# Patient Record
Sex: Male | Born: 1970 | Race: White | Hispanic: No | Marital: Married | Smoking: Former smoker
Health system: Southern US, Community
[De-identification: ages and names within clinical notes are randomized; demographics above are authoritative.]

## PROBLEM LIST (undated history)

## (undated) DIAGNOSIS — R55 Syncope and collapse: Secondary | ICD-10-CM

## (undated) DIAGNOSIS — E782 Mixed hyperlipidemia: Secondary | ICD-10-CM

## (undated) DIAGNOSIS — A6000 Herpesviral infection of urogenital system, unspecified: Secondary | ICD-10-CM

## (undated) DIAGNOSIS — S12101A Unspecified nondisplaced fracture of second cervical vertebra, initial encounter for closed fracture: Secondary | ICD-10-CM

## (undated) DIAGNOSIS — G5600 Carpal tunnel syndrome, unspecified upper limb: Secondary | ICD-10-CM

## (undated) DIAGNOSIS — M549 Dorsalgia, unspecified: Secondary | ICD-10-CM

## (undated) DIAGNOSIS — I10 Essential (primary) hypertension: Secondary | ICD-10-CM

## (undated) HISTORY — DX: Syncope and collapse: R55

## (undated) HISTORY — DX: Herpesviral infection of urogenital system, unspecified: A60.00

## (undated) HISTORY — DX: Carpal tunnel syndrome, unspecified upper limb: G56.00

## (undated) HISTORY — DX: Essential (primary) hypertension: I10

## (undated) HISTORY — DX: Unspecified nondisplaced fracture of second cervical vertebra, initial encounter for closed fracture: S12.101A

## (undated) HISTORY — DX: Dorsalgia, unspecified: M54.9

## (undated) HISTORY — DX: Mixed hyperlipidemia: E78.2

---

## 2007-04-24 HISTORY — PX: ARTHROSCOPIC REPAIR ACL: SUR80

## 2009-12-02 ENCOUNTER — Emergency Department: Payer: Self-pay | Admitting: Emergency Medicine

## 2009-12-06 ENCOUNTER — Ambulatory Visit: Payer: Self-pay | Admitting: Specialist

## 2009-12-29 ENCOUNTER — Ambulatory Visit: Payer: Self-pay | Admitting: Anesthesiology

## 2010-02-06 ENCOUNTER — Ambulatory Visit: Payer: Self-pay | Admitting: Anesthesiology

## 2010-03-01 ENCOUNTER — Ambulatory Visit: Payer: Self-pay | Admitting: Anesthesiology

## 2012-07-16 DIAGNOSIS — G5600 Carpal tunnel syndrome, unspecified upper limb: Secondary | ICD-10-CM

## 2012-07-16 HISTORY — DX: Carpal tunnel syndrome, unspecified upper limb: G56.00

## 2012-08-20 DIAGNOSIS — M549 Dorsalgia, unspecified: Secondary | ICD-10-CM

## 2012-08-20 HISTORY — DX: Dorsalgia, unspecified: M54.9

## 2013-04-13 DIAGNOSIS — A6000 Herpesviral infection of urogenital system, unspecified: Secondary | ICD-10-CM | POA: Insufficient documentation

## 2013-04-13 HISTORY — DX: Herpesviral infection of urogenital system, unspecified: A60.00

## 2013-06-05 DIAGNOSIS — Z308 Encounter for other contraceptive management: Secondary | ICD-10-CM | POA: Insufficient documentation

## 2015-10-17 DIAGNOSIS — S12101A Unspecified nondisplaced fracture of second cervical vertebra, initial encounter for closed fracture: Secondary | ICD-10-CM

## 2015-10-17 HISTORY — DX: Unspecified nondisplaced fracture of second cervical vertebra, initial encounter for closed fracture: S12.101A

## 2016-04-05 DIAGNOSIS — R55 Syncope and collapse: Secondary | ICD-10-CM

## 2016-04-05 DIAGNOSIS — E782 Mixed hyperlipidemia: Secondary | ICD-10-CM

## 2016-04-05 DIAGNOSIS — I10 Essential (primary) hypertension: Secondary | ICD-10-CM

## 2016-04-05 HISTORY — DX: Essential (primary) hypertension: I10

## 2016-04-05 HISTORY — DX: Syncope and collapse: R55

## 2016-04-05 HISTORY — DX: Mixed hyperlipidemia: E78.2

## 2017-05-09 ENCOUNTER — Other Ambulatory Visit: Payer: Self-pay

## 2017-05-31 ENCOUNTER — Encounter: Payer: Self-pay | Admitting: Urology

## 2017-05-31 ENCOUNTER — Ambulatory Visit (INDEPENDENT_AMBULATORY_CARE_PROVIDER_SITE_OTHER): Payer: BLUE CROSS/BLUE SHIELD | Admitting: Urology

## 2017-05-31 VITALS — BP 133/81 | HR 64 | Ht 70.0 in | Wt 215.0 lb

## 2017-05-31 DIAGNOSIS — R972 Elevated prostate specific antigen [PSA]: Secondary | ICD-10-CM

## 2017-05-31 NOTE — Progress Notes (Signed)
05/31/2017 4:08 PM   Craig Villa 11-12-1970 [id number]  Referring provider: Dione Housekeeperlmedo, Mario Ernesto, MD [address] [place], [place] [id number]  Chief Complaint  Patient presents with  . Elevated PSA    New Patient    HPI: 47 year old male referred for further evaluation of elevated PSA.  He had a PSA in 05/06/2017 which was 2.43.  This appears to have been essentially stable over the past 2 years.  Care everywhere reviewed for details.  He denies any baseline urinary symptoms including no frequency, urgency, nocturia, decreased stream, or sensation of incomplete bladder emptying.  He denies history of UTIs or gross hematuria.  No family history of prostate cancer.  No weight loss or bone pain.  PMH: Past Medical History:  Diagnosis Date  . Back pain 08/20/2012   Overview:  Had ortho eval including [facility] and then seen [facility] in [place].  . Cardiac syncope 04/05/2016  . Carpal tunnel syndrome 07/16/2012   Overview:  EMG/NCS 11/02 showed moderate Right and Mild Left, saw Reita ChardChris Smith, elected non surgical  . Closed nondisplaced fracture of second cervical vertebra (HCC) 10/17/2015  . Essential hypertension 04/05/2016  . Herpes genitalis 04/13/2013   Overview:  Likely outbreak, not confirmed, 11/14  . Hyperlipidemia, mixed 04/05/2016   Overview:  lipitor started 12/17  . Hypertension     Surgical History: Past Surgical History:  Procedure Laterality Date  . ARTHROSCOPIC REPAIR ACL  2009    Home Medications:  Allergies as of 05/31/2017   Not on File     Medication List        Accurate as of 05/31/17  4:08 PM. Always use your most recent med list.          atorvastatin 40 MG tablet Commonly known as:  LIPITOR   lisinopril-hydrochlorothiazide 20-25 MG tablet Commonly known as:  PRINZIDE,ZESTORETIC Take by mouth.   meloxicam 15 MG tablet Commonly known as:  MOBIC Take by mouth.       Allergies: Not on File  Family History: Family  History  Problem Relation Age of Onset  . Prostate cancer Neg Hx   . Kidney cancer Neg Hx     Social History:  reports that he has quit smoking. he has never used smokeless tobacco. He reports that he drinks alcohol. He reports that he does not use drugs.  ROS: UROLOGY Frequent Urination?: No Hard to postpone urination?: No Burning/pain with urination?: No Get up at night to urinate?: No Leakage of urine?: No Urine stream starts and stops?: No Trouble starting stream?: No Do you have to strain to urinate?: No Blood in urine?: No Urinary tract infection?: No Sexually transmitted disease?: No Injury to kidneys or bladder?: No Painful intercourse?: No Weak stream?: No Erection problems?: No Penile pain?: No  Gastrointestinal Nausea?: No Vomiting?: No Indigestion/heartburn?: No Diarrhea?: No Constipation?: No  Constitutional Fever: No Night sweats?: No Weight loss?: No Fatigue?: No  Skin Skin rash/lesions?: No Itching?: No  Eyes Blurred vision?: No Double vision?: No  Ears/Nose/Throat Sore throat?: No Sinus problems?: No  Hematologic/Lymphatic Swollen glands?: No Easy bruising?: No  Cardiovascular Leg swelling?: No Chest pain?: No  Respiratory Shortness of breath?: No  Endocrine Excessive thirst?: No  Musculoskeletal Back pain?: No Joint pain?: No  Neurological Headaches?: No Dizziness?: No  Psychologic Depression?: No Anxiety?: No  Physical Exam: BP 133/81   Pulse 64   Ht 5\' 10"  (1.778 m)   Wt 215 lb (97.5 kg)   BMI  30.85 kg/m   Constitutional:  Alert and oriented, No acute distress. HEENT: [place] AT, moist mucus membranes.  Trachea midline, no masses. Cardiovascular: No clubbing, cyanosis, or edema. Respiratory: Normal respiratory effort, no increased work of breathing. GI: Abdomen is soft, nontender, nondistended, no abdominal masses GU: No CVA tenderness.  Rectal: Small external hemorrhoid.  Normal sphincter tone.  40 cc prostate,  nontender, no nodules. Skin: No rashes, bruises or suspicious lesions. Neurologic: Grossly intact, no focal deficits, moving all 4 extremities. Psychiatric: Normal mood and affect.  Laboratory Data: Creatinine 1.0 on 05/06/2017  PSA trend 05/06/2017 2.43 11/22/2015 2.54 10/07/2014 2.61   Urinalysis n/a  Pertinent Imaging: N/a  Assessment & Plan:    1. Elevated PSA  We reviewed the implications of an elevated PSA and the uncertainty surrounding it. In general, a man's PSA increases with age and is produced by both normal and cancerous prostate tissue. Differential for elevated PSA is BPH, prostate cancer, infection, recent intercourse/ejaculation, prostate infarction, recent urethroscopic manipulation (foley placement/cystoscopy) and prostatitis. Management of an elevated PSA can include observation or prostate biopsy and wediscussed this in detail.  We discussed that indications for prostate biopsy are defined by age and race specific PSA cutoffs as well as a PSA velocity of 0.75/year.  Although his PSA is more elevated than one would expect for his age, he does have a enlarged prostate therefore his PSA density may be appropriate.  The fact that his PSA has been stable over the past 3 years without any fluctuation is very reassuring.  His rectal exam was also stable.  I recommended referral back to urology if his PSA increases or than 0.5 in 1 year.  He is agreeable with this plan.    Vanna Scotland, MD  [facility] [address] [place], [place] [address] [phone]

## 2017-06-12 ENCOUNTER — Other Ambulatory Visit: Payer: Self-pay

## 2017-06-12 ENCOUNTER — Ambulatory Visit
Admission: EM | Admit: 2017-06-12 | Discharge: 2017-06-12 | Disposition: A | Discharge status: Home / Self Care | Payer: BLUE CROSS/BLUE SHIELD | Attending: Family Medicine | Admitting: Family Medicine

## 2017-06-12 DIAGNOSIS — R1032 Left lower quadrant pain: Secondary | ICD-10-CM

## 2017-06-12 LAB — URINALYSIS, COMPLETE (UACMP) WITH MICROSCOPIC
BACTERIA UA: NONE SEEN
Bilirubin Urine: NEGATIVE
GLUCOSE, UA: NEGATIVE mg/dL
Hgb urine dipstick: NEGATIVE
Leukocytes, UA: NEGATIVE
Nitrite: NEGATIVE
PROTEIN: NEGATIVE mg/dL
RBC / HPF: NONE SEEN RBC/hpf (ref 0–5)
SQUAMOUS EPITHELIAL / LPF: NONE SEEN
Specific Gravity, Urine: 1.02 (ref 1.005–1.030)
pH: 5.5 (ref 5.0–8.0)

## 2017-06-12 NOTE — Discharge Instructions (Signed)
Recommend patient go to Emergency Department for further evaluation and management °

## 2017-06-12 NOTE — ED Triage Notes (Addendum)
Pt c/o abdominal pain since 1 am in his lower abdomen and radiates to the left side. He had a temperature 101.7, he tried to sleep it off and it hasn't gone away. He has recently rechecked temp and it is normal. Thinks may be a hernia. Pain is new and he has noticed a change in bowel habits the last couple days.

## 2017-06-12 NOTE — ED Provider Notes (Signed)
[facility]    CSN: [id number] Arrival date & time: 06/12/17  1641     History   Chief Complaint Chief Complaint  Patient presents with  . Abdominal Pain    lower left side    HPI Craig Villa is a 47 y.o. male.   The history is provided by the patient.  Abdominal Pain  Pain location:  LLQ Pain quality: aching and throbbing   Pain radiates to:  Does not radiate Pain severity:  Moderate Onset quality:  Sudden Duration:  16 hours Timing:  Constant Progression:  Waxing and waning Chronicity:  New Context: awakening from sleep (states woke up in the middle of the night last nigh with the pain)   Context: not alcohol use, not diet changes, not eating, not laxative use, not medication withdrawal, not previous surgeries, not recent illness, not recent sexual activity, not recent travel, not retching, not sick contacts, not suspicious food intake and not trauma   Relieved by:  NSAIDs (ibuprofen) Associated symptoms: chills (states last night had some chills), fever and nausea   Associated symptoms: no anorexia, no belching, no chest pain, no constipation, no cough, no diarrhea, no dysuria, no fatigue, no flatus, no hematemesis, no hematochezia, no hematuria, no melena, no shortness of breath, no sore throat, no vaginal bleeding, no vaginal discharge and no vomiting   Risk factors: no alcohol abuse, no aspirin use, not elderly, has not had multiple surgeries, not obese and no recent hospitalization     Past Medical History:  Diagnosis Date  . Back pain 08/20/2012   Overview:  Had [facility] eval [facility] and then seen [facility] in [place].  . Cardiac syncope 04/05/2016  . Carpal tunnel syndrome 07/16/2012   Overview:  EMG/NCS 11/02 showed moderate Right and Mild Left, saw Reita ChardChris Smith, elected non surgical  . Closed nondisplaced fracture of second cervical vertebra (HCC) 10/17/2015  . Essential hypertension 04/05/2016  . Herpes genitalis 04/13/2013   Overview:  Likely outbreak, not confirmed, 11/14  . Hyperlipidemia, mixed 04/05/2016   Overview:  lipitor started 12/17  . Hypertension     Patient Active Problem List   Diagnosis Date Noted  . Cardiac syncope 04/05/2016  . Essential hypertension 04/05/2016  . Hyperlipidemia, mixed 04/05/2016  . Closed nondisplaced fracture of second cervical vertebra (HCC) 10/17/2015  . Encounter for other contraceptive management 06/05/2013  . Herpes genitalis 04/13/2013  . Back pain 08/20/2012  . Carpal tunnel syndrome 07/16/2012    Past Surgical History:  Procedure Laterality Date  . ARTHROSCOPIC REPAIR ACL  2009       Home Medications    Prior to Admission medications   Medication Sig Start Date End Date Taking? Authorizing Provider  lisinopril-hydrochlorothiazide (PRINZIDE,ZESTORETIC) 20-25 MG tablet Take by mouth. 05/06/17 06/06/18 Yes [provider]  meloxicam (MOBIC) 15 MG tablet Take by mouth. 05/06/17 06/06/18 Yes [provider]  atorvastatin (LIPITOR) 40 MG tablet  04/29/17   [provider]    Family History Family History  Problem Relation Age of Onset  . Prostate cancer Neg Hx   . Kidney cancer Neg Hx     Social History Social History   Tobacco Use  . Smoking status: Former Games developermoker  . Smokeless tobacco: Never Used  Substance Use Topics  . Alcohol use: Yes  . Drug use: No     Allergies   Patient has no known allergies.   Review of Systems Review of Systems  Constitutional: Positive for chills (states  last night had some chills) and fever. Negative for fatigue.  HENT: Negative for sore throat.   Respiratory: Negative for cough and shortness of breath.   Cardiovascular: Negative for chest pain.  Gastrointestinal: Positive for abdominal pain and nausea. Negative for anorexia, constipation, diarrhea, flatus, hematemesis, hematochezia, melena and vomiting.  Genitourinary: Negative for dysuria, hematuria, vaginal bleeding and vaginal  discharge.     Physical Exam Triage Vital Signs ED Triage Vitals  Enc Vitals Group     BP 06/12/17 1708 127/89     Pulse Rate 06/12/17 1708 77     Resp 06/12/17 1708 18     Temp 06/12/17 1708 98.1 F (36.7 C)     Temp Source 06/12/17 1708 Oral     SpO2 06/12/17 1708 100 %     Weight 06/12/17 1710 215 lb (97.5 kg)     Height --      Head Circumference --      Peak Flow --      Pain Score 06/12/17 1710 7     Pain Loc --      Pain Edu? --      Excl. in GC? --    No data found.  Updated Vital Signs BP 127/89 (BP Location: Left Arm)   Pulse 77   Temp 98.1 F (36.7 C) (Oral)   Resp 18   Wt 215 lb (97.5 kg)   SpO2 100%   BMI 30.85 kg/m   Visual Acuity Right Eye Distance:   Left Eye Distance:   Bilateral Distance:    Right Eye Near:   Left Eye Near:    Bilateral Near:     Physical Exam  Constitutional: He is oriented to person, place, and time. He appears well-developed and well-nourished. No distress.  HENT:  Head: Normocephalic and atraumatic.  Cardiovascular: Normal rate, regular rhythm, normal heart sounds and intact distal pulses.  No murmur heard. Pulmonary/Chest: Effort normal and breath sounds normal. No respiratory distress. He has no wheezes. He has no rales.  Abdominal: Soft. Bowel sounds are normal. He exhibits no distension and no mass. There is tenderness (left lower quadrant; moderate). There is no rebound and no guarding.  Neurological: He is alert and oriented to person, place, and time.  Skin: No rash noted. He is not diaphoretic.  Nursing note and vitals reviewed.    UC Treatments / Results  Labs (all labs ordered are listed, but only abnormal results are displayed) Labs Reviewed  URINALYSIS, COMPLETE (UACMP) WITH MICROSCOPIC - Abnormal; Notable for the following components:      Result Value   Ketones, ur TRACE (*)    All other components within normal limits    EKG  EKG Interpretation None       Radiology No results  found.  Procedures Procedures (including critical care time)  Medications Ordered in UC Medications - No data to display   Initial Impression / Assessment and Plan / UC Course  I have reviewed the triage vital signs and the nursing notes.  Pertinent labs & imaging results that were available during my care of the patient were reviewed by me and considered in my medical decision making (see chart for details).       Final Clinical Impressions(s) / UC Diagnoses   Final diagnoses:  Abdominal pain, left lower quadrant    ED Discharge Orders    None     1. Lab result (negative UA) and possible/probable diagnosis reviewed with patient; explained concern for acute diverticulitis  and recommend that patient go to the Emergency Department for further evaluation and management; patient verbalizes understanding and will proceed by private vehicle.   Controlled Substance Prescriptions [place] Controlled Substance Registry consulted? Not Applicable   Payton Mccallum, MD 06/12/17 [phone]

## 2020-05-06 ENCOUNTER — Encounter: Payer: Self-pay | Admitting: Emergency Medicine

## 2020-05-06 ENCOUNTER — Other Ambulatory Visit: Payer: Self-pay

## 2020-05-06 ENCOUNTER — Ambulatory Visit (INDEPENDENT_AMBULATORY_CARE_PROVIDER_SITE_OTHER): Payer: BLUE CROSS/BLUE SHIELD

## 2020-05-06 ENCOUNTER — Ambulatory Visit
Admission: EM | Admit: 2020-05-06 | Discharge: 2020-05-06 | Disposition: A | Discharge status: Home / Self Care | Payer: BLUE CROSS/BLUE SHIELD | Attending: Family Medicine | Admitting: Family Medicine

## 2020-05-06 DIAGNOSIS — Z79899 Other long term (current) drug therapy: Secondary | ICD-10-CM | POA: Diagnosis not present

## 2020-05-06 DIAGNOSIS — J1282 Pneumonia due to coronavirus disease 2019: Secondary | ICD-10-CM | POA: Diagnosis not present

## 2020-05-06 DIAGNOSIS — R109 Unspecified abdominal pain: Secondary | ICD-10-CM | POA: Diagnosis present

## 2020-05-06 DIAGNOSIS — U071 COVID-19: Secondary | ICD-10-CM | POA: Diagnosis not present

## 2020-05-06 DIAGNOSIS — R059 Cough, unspecified: Secondary | ICD-10-CM | POA: Diagnosis not present

## 2020-05-06 DIAGNOSIS — Z87891 Personal history of nicotine dependence: Secondary | ICD-10-CM | POA: Diagnosis not present

## 2020-05-06 LAB — COMPREHENSIVE METABOLIC PANEL
ALT: 56 U/L — ABNORMAL HIGH (ref 0–44)
AST: 41 U/L (ref 15–41)
Albumin: 4.1 g/dL (ref 3.5–5.0)
Alkaline Phosphatase: 63 U/L (ref 38–126)
Anion gap: 11 (ref 5–15)
BUN: 14 mg/dL (ref 6–20)
CO2: 26 mmol/L (ref 22–32)
Calcium: 9.5 mg/dL (ref 8.9–10.3)
Chloride: 96 mmol/L — ABNORMAL LOW (ref 98–111)
Creatinine, Ser: 0.93 mg/dL (ref 0.61–1.24)
GFR, Estimated: >60 mL/min (ref >60)
Glucose, Bld: 100 mg/dL — ABNORMAL HIGH (ref 70–99)
Potassium: 3.9 mmol/L (ref 3.5–5.1)
Sodium: 133 mmol/L — ABNORMAL LOW (ref 135–145)
Total Bilirubin: 0.6 mg/dL (ref 0.3–1.2)
Total Protein: 8.1 g/dL (ref 6.5–8.1)

## 2020-05-06 LAB — CBC WITH DIFFERENTIAL/PLATELET
Abs Immature Granulocytes: 0.02 10*3/uL (ref 0.00–0.07)
Basophils Absolute: 0 10*3/uL (ref 0.0–0.1)
Basophils Relative: 0 %
Eosinophils Absolute: 0 10*3/uL (ref 0.0–0.5)
Eosinophils Relative: 0 %
HCT: 46.8 % (ref 39.0–52.0)
Hemoglobin: 16.4 g/dL (ref 13.0–17.0)
Immature Granulocytes: 1 %
Lymphocytes Relative: 30 %
Lymphs Abs: 1.2 10*3/uL (ref 0.7–4.0)
MCH: 32.2 pg (ref 26.0–34.0)
MCHC: 35 g/dL (ref 30.0–36.0)
MCV: 91.9 fL (ref 80.0–100.0)
Monocytes Absolute: 0.5 10*3/uL (ref 0.1–1.0)
Monocytes Relative: 11 %
Neutro Abs: 2.4 10*3/uL (ref 1.7–7.7)
Neutrophils Relative %: 58 %
Platelets: 143 10*3/uL — ABNORMAL LOW (ref 150–400)
RBC: 5.09 MIL/uL (ref 4.22–5.81)
RDW: 13.2 % (ref 11.5–15.5)
WBC: 4.2 10*3/uL (ref 4.0–10.5)
nRBC: 0 % (ref 0.0–0.2)

## 2020-05-06 LAB — LIPASE, BLOOD: Lipase: 36 U/L (ref 11–51)

## 2020-05-06 MED ORDER — HYDROCOD POLST-CPM POLST ER 10-8 MG/5ML PO SUER: 5.0000 mL | Freq: Every evening | ORAL | 0 refills | Status: AC | PRN

## 2020-05-06 MED ORDER — KETOROLAC TROMETHAMINE 10 MG PO TABS
10.0000 mg | ORAL_TABLET | Freq: Four times a day (QID) | ORAL | 0 refills | Status: AC | PRN
Start: 2020-05-06 — End: ?

## 2020-05-06 NOTE — Discharge Instructions (Signed)
Medication as prescribed.  Take care  Dr. [employer]  

## 2020-05-06 NOTE — ED Provider Notes (Signed)
[facility]    CSN: [id number] Arrival date & time: 05/06/20  1007      History   Chief Complaint Chief Complaint  Patient presents with  . Diarrhea  . Abdominal Cramping  . Insomnia   HPI  49 year old male presents with the above complaints.  Patient reports that he developed symptoms on 1/4. Tested positive for COVID on 1/6. Patient states that he was doing well until the past 2 days. He states that he has now developed diarrhea, abdominal pain, and cough. He reports severe fatigue. He states that his abdominal pain is diffuse and feels crampy in nature. He states that he is having chills. He states that this is keeping him up at night. He is getting very little sleep. No relieving factors. No fever. No other complaints at this time.  Past Medical History:  Diagnosis Date  . Back pain 08/20/2012   Overview:  Had ortho eval including MRI at [facility] and then seen [facility] in [place].  . Cardiac syncope 04/05/2016  . Carpal tunnel syndrome 07/16/2012   Overview:  EMG/NCS 11/02 showed moderate Right and Mild Left, saw Reita Chard, elected non surgical  . Closed nondisplaced fracture of second cervical vertebra (HCC) 10/17/2015  . Essential hypertension 04/05/2016  . Herpes genitalis 04/13/2013   Overview:  Likely outbreak, not confirmed, 11/14  . Hyperlipidemia, mixed 04/05/2016   Overview:  lipitor started 12/17  . Hypertension     Patient Active Problem List   Diagnosis Date Noted  . Cardiac syncope 04/05/2016  . Essential hypertension 04/05/2016  . Hyperlipidemia, mixed 04/05/2016  . Closed nondisplaced fracture of second cervical vertebra (HCC) 10/17/2015  . Encounter for other contraceptive management 06/05/2013  . Herpes genitalis 04/13/2013  . Back pain 08/20/2012  . Carpal tunnel syndrome 07/16/2012    Past Surgical History:  Procedure Laterality Date  . ARTHROSCOPIC REPAIR ACL  2009       Home Medications    Prior to Admission  medications   Medication Sig Start Date End Date Taking? Authorizing Provider  chlorpheniramine-HYDROcodone (TUSSIONEX PENNKINETIC ER) 10-8 MG/5ML SUER Take 5 mLs by mouth at bedtime as needed for cough. 05/06/20  Yes Yeshaya Vath G, DO  ketorolac (TORADOL) 10 MG tablet Take 1 tablet (10 mg total) by mouth every 6 (six) hours as needed for moderate pain or severe pain. 05/06/20  Yes Kodie Pick G, DO  lisinopril-hydrochlorothiazide (PRINZIDE,ZESTORETIC) 20-25 MG tablet Take by mouth. 05/06/17 05/06/20 Yes [provider]  atorvastatin (LIPITOR) 40 MG tablet  04/29/17 05/06/20  [provider]    Family History Family History  Problem Relation Age of Onset  . Prostate cancer Neg Hx   . Kidney cancer Neg Hx     Social History Social History   Tobacco Use  . Smoking status: Former Games developer  . Smokeless tobacco: Never Used  Substance Use Topics  . Alcohol use: Yes  . Drug use: No     Allergies   Patient has no known allergies.   Review of Systems Review of Systems Per HPI  Physical Exam Triage Vital Signs ED Triage Vitals  Enc Vitals Group     BP 05/06/20 1130 (!) 146/103     Pulse Rate 05/06/20 1130 69     Resp 05/06/20 1130 18     Temp 05/06/20 1130 99.2 F (37.3 C)     Temp Source 05/06/20 1130 Oral     SpO2 05/06/20 1130 100 %     Weight 05/06/20  1128 195 lb (88.5 kg)     Height 05/06/20 1128 5\' 10"  (1.778 m)     Head Circumference --      Peak Flow --      Pain Score 05/06/20 1127 0     Pain Loc --      Pain Edu? --      Excl. in GC? --    Updated Vital Signs BP (!) 146/103 (BP Location: Right Arm)   Pulse 69   Temp 99.2 F (37.3 C) (Oral)   Resp 18   Ht 5\' 10"  (1.778 m)   Wt 88.5 kg   SpO2 100%   BMI 27.98 kg/m   Visual Acuity Right Eye Distance:   Left Eye Distance:   Bilateral Distance:    Right Eye Near:   Left Eye Near:    Bilateral Near:     Physical Exam Constitutional:      General: He is not in acute distress.     Appearance: Normal appearance.  HENT:     Head: Normocephalic and atraumatic.  Eyes:     General:        Right eye: No discharge.        Left eye: No discharge.     Conjunctiva/sclera: Conjunctivae normal.  Cardiovascular:     Rate and Rhythm: Normal rate and regular rhythm.     Heart sounds: No murmur heard.   Pulmonary:     Effort: Pulmonary effort is normal.     Breath sounds: No wheezing.  Abdominal:     General: There is no distension.     Palpations: Abdomen is soft.     Tenderness: There is no abdominal tenderness.  Neurological:     Mental Status: He is alert.  Psychiatric:        Mood and Affect: Mood normal.        Behavior: Behavior normal.     UC Treatments / Results  Labs (all labs ordered are listed, but only abnormal results are displayed) Labs Reviewed  CBC WITH DIFFERENTIAL/PLATELET - Abnormal; Notable for the following components:      Result Value   Platelets 143 (*)    All other components within normal limits  COMPREHENSIVE METABOLIC PANEL - Abnormal; Notable for the following components:   Sodium 133 (*)    Chloride 96 (*)    Glucose, Bld 100 (*)    ALT 56 (*)    All other components within normal limits  LIPASE, BLOOD    EKG   Radiology DG Chest 2 View  Result Date: 05/06/2020 CLINICAL DATA:  Cough EXAM: CHEST - 2 VIEW COMPARISON:  None FINDINGS: Trachea midline. Cardiomediastinal contours and hilar structures are normal. Bilateral patchy opacities in the chest, for instance in the LEFT upper to mid chest and RIGHT mid chest also in the RIGHT upper lobe. No lobar consolidation. No sign of pleural effusion. On limited assessment no acute skeletal process. IMPRESSION: Bilateral patchy opacities in the chest, suspicious for multifocal pneumonia, consider atypical or viral process such as COVID-19 based on appearance and distribution. Given nodular pattern would suggest follow-up to ensure resolution. Electronically Signed   By:  M.D.   On: 05/06/2020 12:08    Procedures Procedures (including critical care time)  Medications Ordered in UC Medications - No data to display  Initial Impression / Assessment and Plan / UC Course  I have reviewed the triage vital signs and the nursing notes.  Pertinent labs & imaging  results that were available during my care of the patient were reviewed by me and considered in my medical decision making (see chart for details).    50 year old male presents with symptoms in the setting of COVID-19. Chest x-Tejuan performed and was independently interpreted by me. Interpretation: Multifocal pneumonia consistent with COVID-19 pneumonia.  Laboratory studies unremarkable.  Tussidex for cough.  Toradol for body aches.  Supportive care.  Final Clinical Impressions(s) / UC Diagnoses   Final diagnoses:  Pneumonia due to COVID-19 virus     Discharge Instructions     Medication as prescribed.  Take care  Dr. Adriana Simas     ED Prescriptions    Medication Sig Dispense Auth. Provider   chlorpheniramine-HYDROcodone (TUSSIONEX PENNKINETIC ER) 10-8 MG/5ML SUER Take 5 mLs by mouth at bedtime as needed for cough. 60 mL Julieana Eshleman G, DO   ketorolac (TORADOL) 10 MG tablet Take 1 tablet (10 mg total) by mouth every 6 (six) hours as needed for moderate pain or severe pain. 20 tablet Tommie Sams, DO     PDMP not reviewed this encounter.   Tommie Sams, [place] 05/06/20 1336

## 2020-05-06 NOTE — ED Triage Notes (Signed)
Patient states he tested positive for COVID on 01/06 but symptoms started on 01/04. He states he started feeling better and went back to work. He states he now has diarrhea, abdominal cramping and unable to sleep x 2 days.

## 2021-04-23 IMAGING — CR DG CHEST 2V
2 series · 2 of 2 positions shown · non-contrast
Comparison: None

CLINICAL DATA: Cough

EXAM:
CHEST - 2 VIEW

[chest lat]
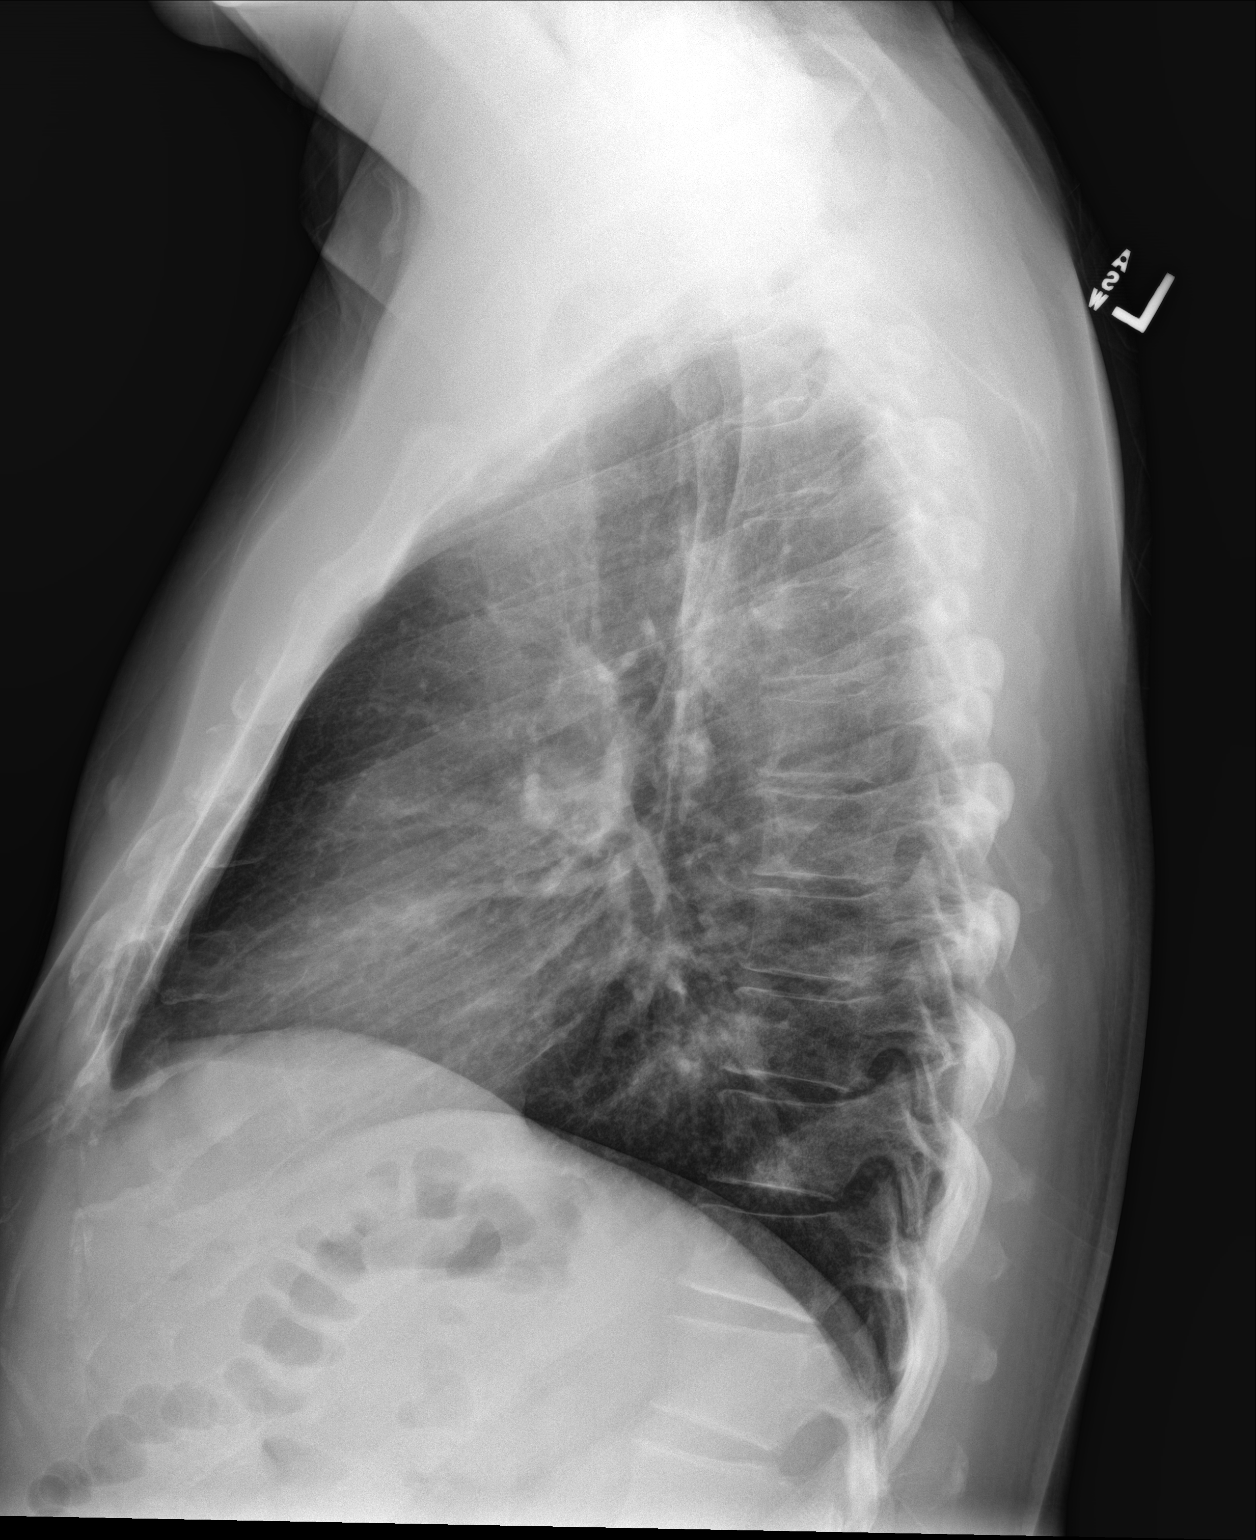

[chest pa]
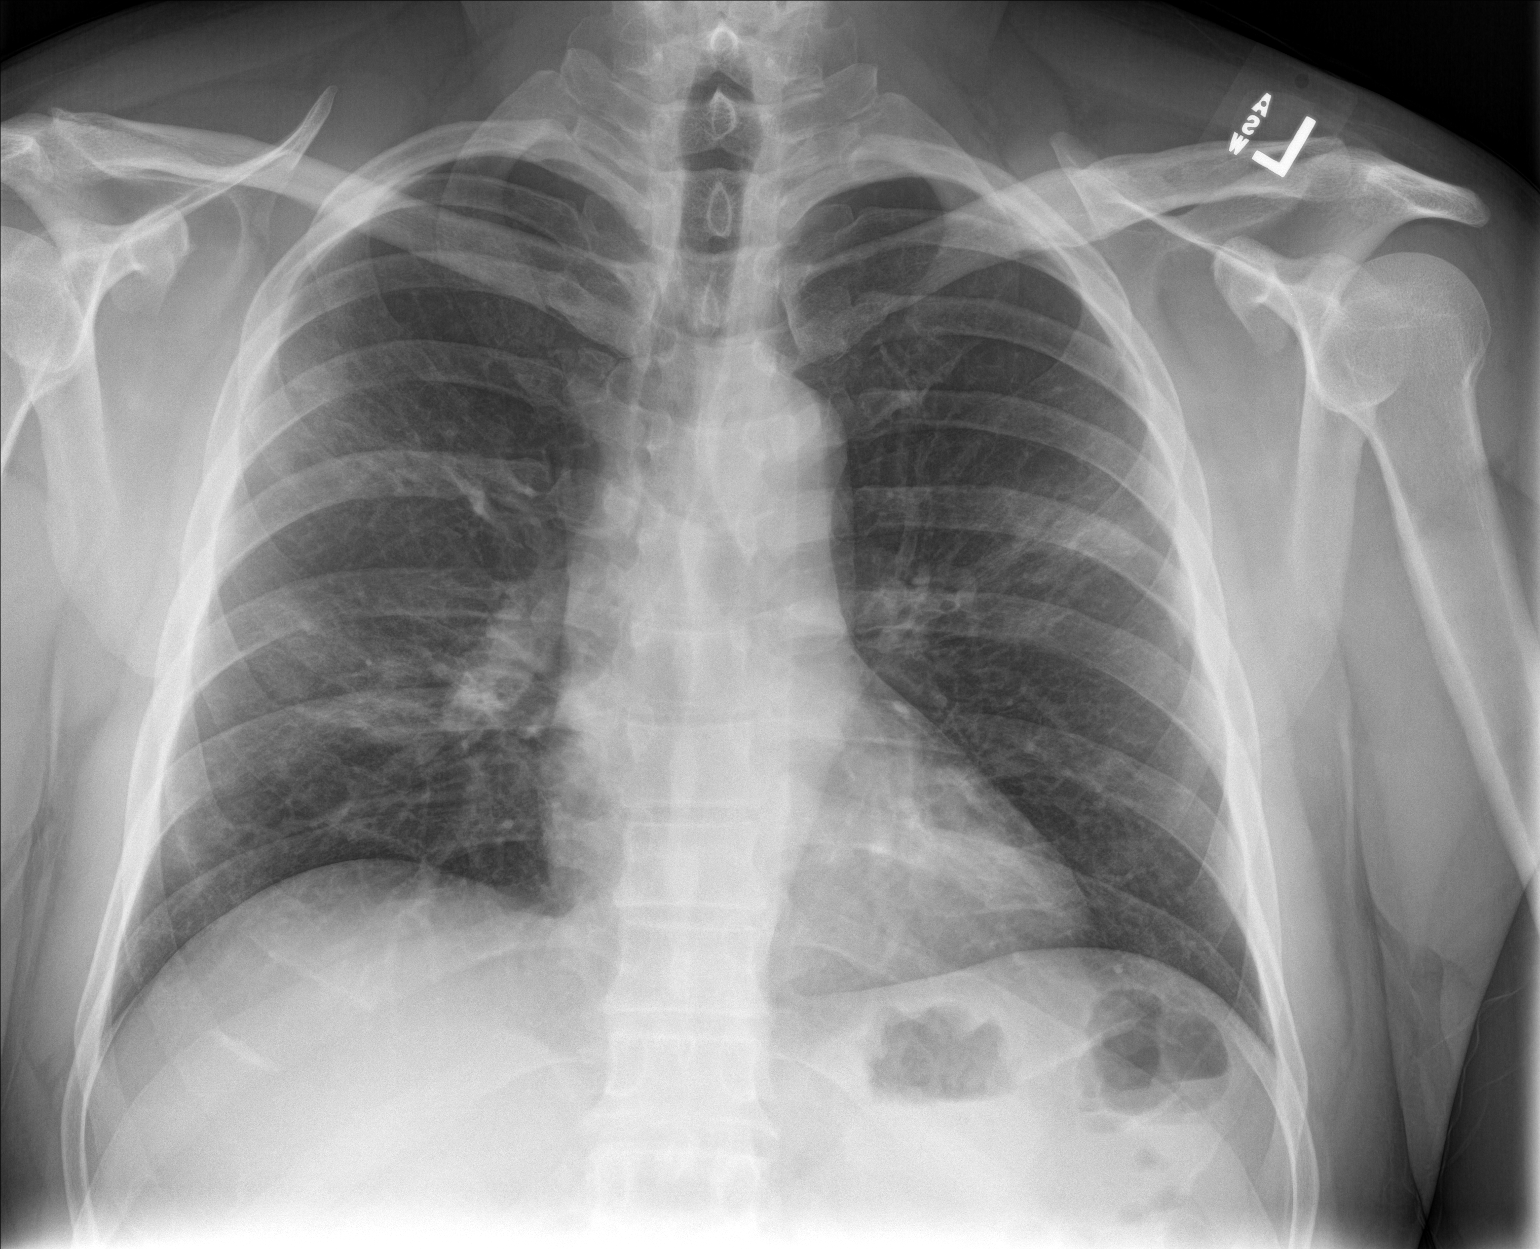

[2 of 2 positions shown; findings below may reference images not displayed]

FINDINGS: Trachea midline. Cardiomediastinal contours and hilar structures are
normal.

Bilateral patchy opacities in the chest, for instance in the LEFT
upper to mid chest and RIGHT mid chest also in the RIGHT upper lobe.
No lobar consolidation. No sign of pleural effusion.

On limited assessment no acute skeletal process.
IMPRESSION: Bilateral patchy opacities in the chest, suspicious for multifocal
pneumonia, consider atypical or viral process such as [id number] based
on appearance and distribution.

Given nodular pattern would suggest follow-up to ensure resolution.

## 2022-03-31 ENCOUNTER — Encounter: Payer: Self-pay | Admitting: Emergency Medicine

## 2022-03-31 ENCOUNTER — Ambulatory Visit
Admission: EM | Admit: 2022-03-31 | Discharge: 2022-03-31 | Disposition: A | Discharge status: Home / Self Care | Payer: BC Managed Care – PPO | Attending: Physician Assistant | Admitting: Physician Assistant

## 2022-03-31 DIAGNOSIS — Z23 Encounter for immunization: Secondary | ICD-10-CM

## 2022-03-31 DIAGNOSIS — S61012A Laceration without foreign body of left thumb without damage to nail, initial encounter: Secondary | ICD-10-CM

## 2022-03-31 MED ORDER — TETANUS-DIPHTH-ACELL PERTUSSIS 5-2.5-18.5 LF-MCG/0.5 IM SUSY
0.5000 mL | PREFILLED_SYRINGE | Freq: Once | INTRAMUSCULAR | Status: AC
  Administered 2022-03-31: 0.5 mL via INTRAMUSCULAR

## 2022-03-31 NOTE — ED Provider Notes (Signed)
[facility]    CSN: [id number] Arrival date & time: 03/31/22  1530      History   Chief Complaint Chief Complaint  Patient presents with   Extremity Laceration    Left thumb    HPI Drevin Ortner is a 51 y.o. male.   HPI  Past Medical History:  Diagnosis Date   Back pain 08/20/2012   Overview:  Had ortho eval including MRI at [facility] and then seen [facility] in [place].   Cardiac syncope 04/05/2016   Carpal tunnel syndrome 07/16/2012   Overview:  EMG/NCS 11/02 showed moderate Right and Mild Left, saw Reita Chard, elected non surgical   Closed nondisplaced fracture of second cervical vertebra (HCC) 10/17/2015   Essential hypertension 04/05/2016   Herpes genitalis 04/13/2013   Overview:  Likely outbreak, not confirmed, 11/14   Hyperlipidemia, mixed 04/05/2016   Overview:  lipitor started 12/17   Hypertension     Patient Active Problem List   Diagnosis Date Noted   Cardiac syncope 04/05/2016   Essential hypertension 04/05/2016   Hyperlipidemia, mixed 04/05/2016   Closed nondisplaced fracture of second cervical vertebra (HCC) 10/17/2015   Encounter for other contraceptive management 06/05/2013   Herpes genitalis 04/13/2013   Back pain 08/20/2012   Carpal tunnel syndrome 07/16/2012    Past Surgical History:  Procedure Laterality Date   ARTHROSCOPIC REPAIR ACL  2009       Home Medications    Prior to Admission medications   Medication Sig Start Date End Date Taking? Authorizing Provider  lisinopril-hydrochlorothiazide (PRINZIDE,ZESTORETIC) 20-25 MG tablet Take by mouth. 05/06/17 05/06/20  [provider]  chlorpheniramine-HYDROcodone (TUSSIONEX PENNKINETIC ER) 10-8 MG/5ML SUER Take 5 mLs by mouth at bedtime as needed for cough. 05/06/20   Tommie Sams, DO  ketorolac (TORADOL) 10 MG tablet Take 1 tablet (10 mg total) by mouth every 6 (six) hours as needed for moderate pain or severe pain. 05/06/20   Tommie Sams, DO  atorvastatin (LIPITOR)  40 MG tablet  04/29/17 05/06/20  [provider]    Family History Family History  Problem Relation Age of Onset   Prostate cancer Neg Hx    Kidney cancer Neg Hx     Social History Social History   Tobacco Use   Smoking status: Former   Smokeless tobacco: Never  Building services engineer Use: Never used  Substance Use Topics   Alcohol use: Yes   Drug use: No     Allergies   Patient has no known allergies.   Review of Systems Review of Systems  Musculoskeletal:  Negative for arthralgias and joint swelling.  Skin:  Positive for wound.  Neurological:  Negative for weakness and numbness.     Physical Exam Triage Vital Signs ED Triage Vitals  Enc Vitals Group     BP 03/31/22 1548 118/75     Pulse Rate 03/31/22 1548 64     Resp 03/31/22 1548 15     Temp 03/31/22 1548 98.5 F (36.9 C)     Temp Source 03/31/22 1548 Oral     SpO2 03/31/22 1548 97 %     Weight 03/31/22 1546 195 lb 1.7 oz (88.5 kg)     Height 03/31/22 1546 5\' 10"  (1.778 m)     Head Circumference --      Peak Flow --      Pain Score 03/31/22 1546 2     Pain Loc --      Pain Edu? --  Excl. in GC? --    No data found.  Updated Vital Signs BP 118/75 (BP Location: Right Arm)   Pulse 64   Temp 98.5 F (36.9 C) (Oral)   Resp 15   Ht 5\' 10"  (1.778 m)   Wt 195 lb 1.7 oz (88.5 kg)   SpO2 97%   BMI 27.99 kg/m   Physical Exam Vitals and nursing note reviewed.  Constitutional:      General: He is not in acute distress.    Appearance: He is well-developed.  HENT:     Head: Normocephalic and atraumatic.  Eyes:     Conjunctiva/sclera: Conjunctivae normal.  Cardiovascular:     Rate and Rhythm: Normal rate and regular rhythm.     Heart sounds: No murmur heard. Pulmonary:     Effort: Pulmonary effort is normal. No respiratory distress.     Breath sounds: Normal breath sounds.  Abdominal:     Palpations: Abdomen is soft.     Tenderness: There is no abdominal tenderness.  Musculoskeletal:         General: No swelling.     Cervical back: Neck supple.  Skin:    General: Skin is warm and dry.     Capillary Refill: Capillary refill takes less than 2 seconds.  Neurological:     Mental Status: He is alert.  Psychiatric:        Mood and Affect: Mood normal.      UC Treatments / Results  Labs (all labs ordered are listed, but only abnormal results are displayed) Labs Reviewed - No data to display  EKG   Radiology No results found.  Procedures Procedures (including critical care time)  Medications Ordered in UC Medications - No data to display  Initial Impression / Assessment and Plan / UC Course  I have reviewed the triage vital signs and the nursing notes.  Pertinent labs & imaging results that were available during my care of the patient were reviewed by me and considered in my medical decision making (see chart for details).     *** Final Clinical Impressions(s) / UC Diagnoses   Final diagnoses:  None   Discharge Instructions   None    ED Prescriptions   None    PDMP not reviewed this encounter.

## 2022-03-31 NOTE — Discharge Instructions (Signed)
-  4 sutures were placed today.  Do not get the area wet for 2 days and then clean it gently with soap and water.  Do not soak your finger.  Take Tylenol as needed for pain relief.  May also apply ice. - Look out for any signs infection including increased redness, swelling, pustular drainage and if you note any signs infection, please return for evaluation.  Otherwise, return in 1 week to have your sutures removed. -Additionally, we did update your tetanus today and it should be good for 10 years.

## 2022-03-31 NOTE — ED Triage Notes (Signed)
Patient states that he cut his left thumb on a razor knife at home around 4 hours ago.

## 2022-04-01 DIAGNOSIS — S61012A Laceration without foreign body of left thumb without damage to nail, initial encounter: Secondary | ICD-10-CM | POA: Diagnosis not present

## 2024-03-17 ENCOUNTER — Ambulatory Visit: Payer: Self-pay | Admitting: Podiatry

## 2024-03-17 ENCOUNTER — Ambulatory Visit (INDEPENDENT_AMBULATORY_CARE_PROVIDER_SITE_OTHER)

## 2024-03-17 ENCOUNTER — Encounter: Payer: Self-pay | Admitting: Podiatry

## 2024-03-17 VITALS — Ht 70.0 in | Wt 195.1 lb

## 2024-03-17 DIAGNOSIS — M722 Plantar fascial fibromatosis: Secondary | ICD-10-CM

## 2024-03-17 MED ORDER — MELOXICAM 15 MG PO TABS: 15.0000 mg | ORAL_TABLET | Freq: Every day | ORAL | 1 refills | Status: DC

## 2024-03-29 DIAGNOSIS — M722 Plantar fascial fibromatosis: Secondary | ICD-10-CM | POA: Diagnosis not present

## 2024-03-29 MED ORDER — BETAMETHASONE SOD PHOS & ACET 6 (3-3) MG/ML IJ SUSP
3.0000 mg | Freq: Once | INTRAMUSCULAR | Status: AC
  Administered 2024-03-29: 3 mg via INTRA_ARTICULAR

## 2024-03-29 NOTE — Progress Notes (Signed)
   Chief Complaint  Patient presents with   Foot Pain    Pt is here due to right heel pain, states he has been dx with plantar fasciitis before, pain has bee there for a while but the last few weeks the pain is constant were sometimes he cannot walk on it, he states he's on his feet a lot due to work and wears boots.    Subjective: 53 y.o. male presenting today for evaluation of right heel pain ongoing for several months.  Last few weeks the pain has increased.   Past Medical History:  Diagnosis Date   Back pain 08/20/2012   Overview:  Had ortho eval including MRI at [facility] and then seen [facility] in [place].   Cardiac syncope 04/05/2016   Carpal tunnel syndrome 07/16/2012   Overview:  EMG/NCS 11/02 showed moderate Right and Mild Left, saw Medford Sharps, elected non surgical   Closed nondisplaced fracture of second cervical vertebra (HCC) 10/17/2015   Essential hypertension 04/05/2016   Herpes genitalis 04/13/2013   Overview:  Likely outbreak, not confirmed, 11/14   Hyperlipidemia, mixed 04/05/2016   Overview:  lipitor started 12/17   Hypertension     Past Surgical History:  Procedure Laterality Date   ARTHROSCOPIC REPAIR ACL  2009   No Known Allergies   Objective: Physical Exam General: The patient is alert and oriented x3 in no acute distress.  Dermatology: Skin is warm, dry and supple bilateral lower extremities. Negative for open lesions or macerations bilateral.   Vascular: Dorsalis Pedis and Posterior Tibial pulses palpable bilateral.  Capillary fill time is immediate to all digits.  Neurological: Grossly intact via light touch  Musculoskeletal: Tenderness to palpation to the plantar aspect of the right heel along the plantar fascia. All other joints range of motion within normal limits bilateral. Strength 5/5 in all groups bilateral.   Radiographic exam RT foot 03/17/2024: Normal osseous mineralization. Joint spaces preserved. No fracture/dislocation/boney  destruction. No other soft tissue abnormalities or radiopaque foreign bodies.   Assessment: 1. Plantar fasciitis right  Plan of Care:  -Patient evaluated. Xrays reviewed.   -Injection of 0.5cc Celestone  soluspan injected into the right plantar fascia  -Prescription for meloxicam  15 mg daily  -Advised against going barefoot.  Recommended supportive tennis shoes and sneakers -Recommend daily calf stretching, specifically calf stretches to alleviate posterior calf tightness -Return to clinic 4 weeks   Thresa EMERSON Sar, DPM [facility]  Dr. Thresa EMERSON Sar, DPM    [address] [place], [place] [address]                Office [phone]  Fax [phone]

## 2024-04-10 ENCOUNTER — Telehealth: Payer: Self-pay

## 2024-04-10 NOTE — Telephone Encounter (Signed)
 Patient's orthotics are in and charges are not yet placed. Patient has appointment in [place] on 04/28/2024. Orthotics are in [facility] bin ready to be sent to [facility].

## 2024-04-28 ENCOUNTER — Ambulatory Visit: Admitting: Podiatry

## 2024-05-12 ENCOUNTER — Encounter: Payer: Self-pay | Admitting: Podiatry

## 2024-05-12 ENCOUNTER — Ambulatory Visit: Admitting: Podiatry

## 2024-05-12 VITALS — Ht 70.0 in | Wt 195.1 lb

## 2024-05-12 DIAGNOSIS — M722 Plantar fascial fibromatosis: Secondary | ICD-10-CM

## 2024-05-12 MED ORDER — BETAMETHASONE SOD PHOS & ACET 6 (3-3) MG/ML IJ SUSP
3.0000 mg | Freq: Once | INTRAMUSCULAR | Status: AC
  Administered 2024-05-12: 3 mg via INTRA_ARTICULAR

## 2024-05-12 NOTE — Progress Notes (Signed)
" ° °  Chief Complaint  Patient presents with   Plantar Fasciitis    Pt is here to f/u on right foot due to plantar fasciitis, he states the pain is still there not as bad as before more of a ache to the heel, states the meloxicam  helps. Picking up orthotics today.    Subjective: 54 y.o. male presenting today for evaluation of right heel pain ongoing for several months.  Overall improvement.   Past Medical History:  Diagnosis Date   Back pain 08/20/2012   Overview:  Had ortho eval including MRI at [facility] and then seen [facility] in [place].   Cardiac syncope 04/05/2016   Carpal tunnel syndrome 07/16/2012   Overview:  EMG/NCS 11/02 showed moderate Right and Mild Left, saw [facility] Sharps, elected non surgical   Closed nondisplaced fracture of second cervical vertebra (HCC) 10/17/2015   Essential hypertension 04/05/2016   Herpes genitalis 04/13/2013   Overview:  Likely outbreak, not confirmed, 11/14   Hyperlipidemia, mixed 04/05/2016   Overview:  lipitor started 12/17   Hypertension     Past Surgical History:  Procedure Laterality Date   ARTHROSCOPIC REPAIR ACL  2009   No Known Allergies   Objective: Physical Exam General: The patient is alert and oriented x3 in no acute distress.  Dermatology: Skin is warm, dry and supple bilateral lower extremities. Negative for open lesions or macerations bilateral.   Vascular: Dorsalis Pedis and Posterior Tibial pulses palpable bilateral.  Capillary fill time is immediate to all digits.  Neurological: Grossly intact via light touch  Musculoskeletal: Tenderness to palpation to the plantar aspect of the right heel along the plantar fascia. All other joints range of motion within normal limits bilateral. Strength 5/5 in all groups bilateral.   Radiographic exam RT foot 03/17/2024: Normal osseous mineralization. Joint spaces preserved. No fracture/dislocation/boney destruction. No other soft tissue abnormalities or radiopaque foreign bodies.    Assessment: 1. Plantar fasciitis right  Plan of Care:  -Patient evaluated. Xrays reviewed.   -Injection of 0.5cc Celestone  soluspan injected into the right plantar fascia  - Continue meloxicam  15 mg daily  -Advised against going barefoot.  Recommended supportive tennis shoes and sneakers - Orthotics dispensed today with break instructions provided.  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 Thresa EMERSON Sar, DPM    [address] [place], [place] [address]                Office [phone]  Fax [phone]     "

## 2024-05-13 ENCOUNTER — Other Ambulatory Visit: Payer: Self-pay

## 2024-05-13 DIAGNOSIS — M722 Plantar fascial fibromatosis: Secondary | ICD-10-CM

## 2024-05-23 ENCOUNTER — Other Ambulatory Visit: Payer: Self-pay | Admitting: Podiatry

## 2024-06-09 ENCOUNTER — Ambulatory Visit: Admitting: Podiatry
